# Patient Record
Sex: Female | Born: 1995 | Race: Black or African American | Hispanic: No | Marital: Single | Smoking: Never smoker
Health system: Southern US, Community
[De-identification: ages and names within clinical notes are randomized; demographics above are authoritative.]

---
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
## 2016-05-03 IMAGING — DX DG CHEST 2V
2 series · 2 of 2 positions shown · non-contrast
Comparison: None.

CLINICAL DATA: Upper chest pain, shortness of breath and productive
cough for the past 3 weeks.

EXAM:
CHEST  2 VIEW

[chest pa]
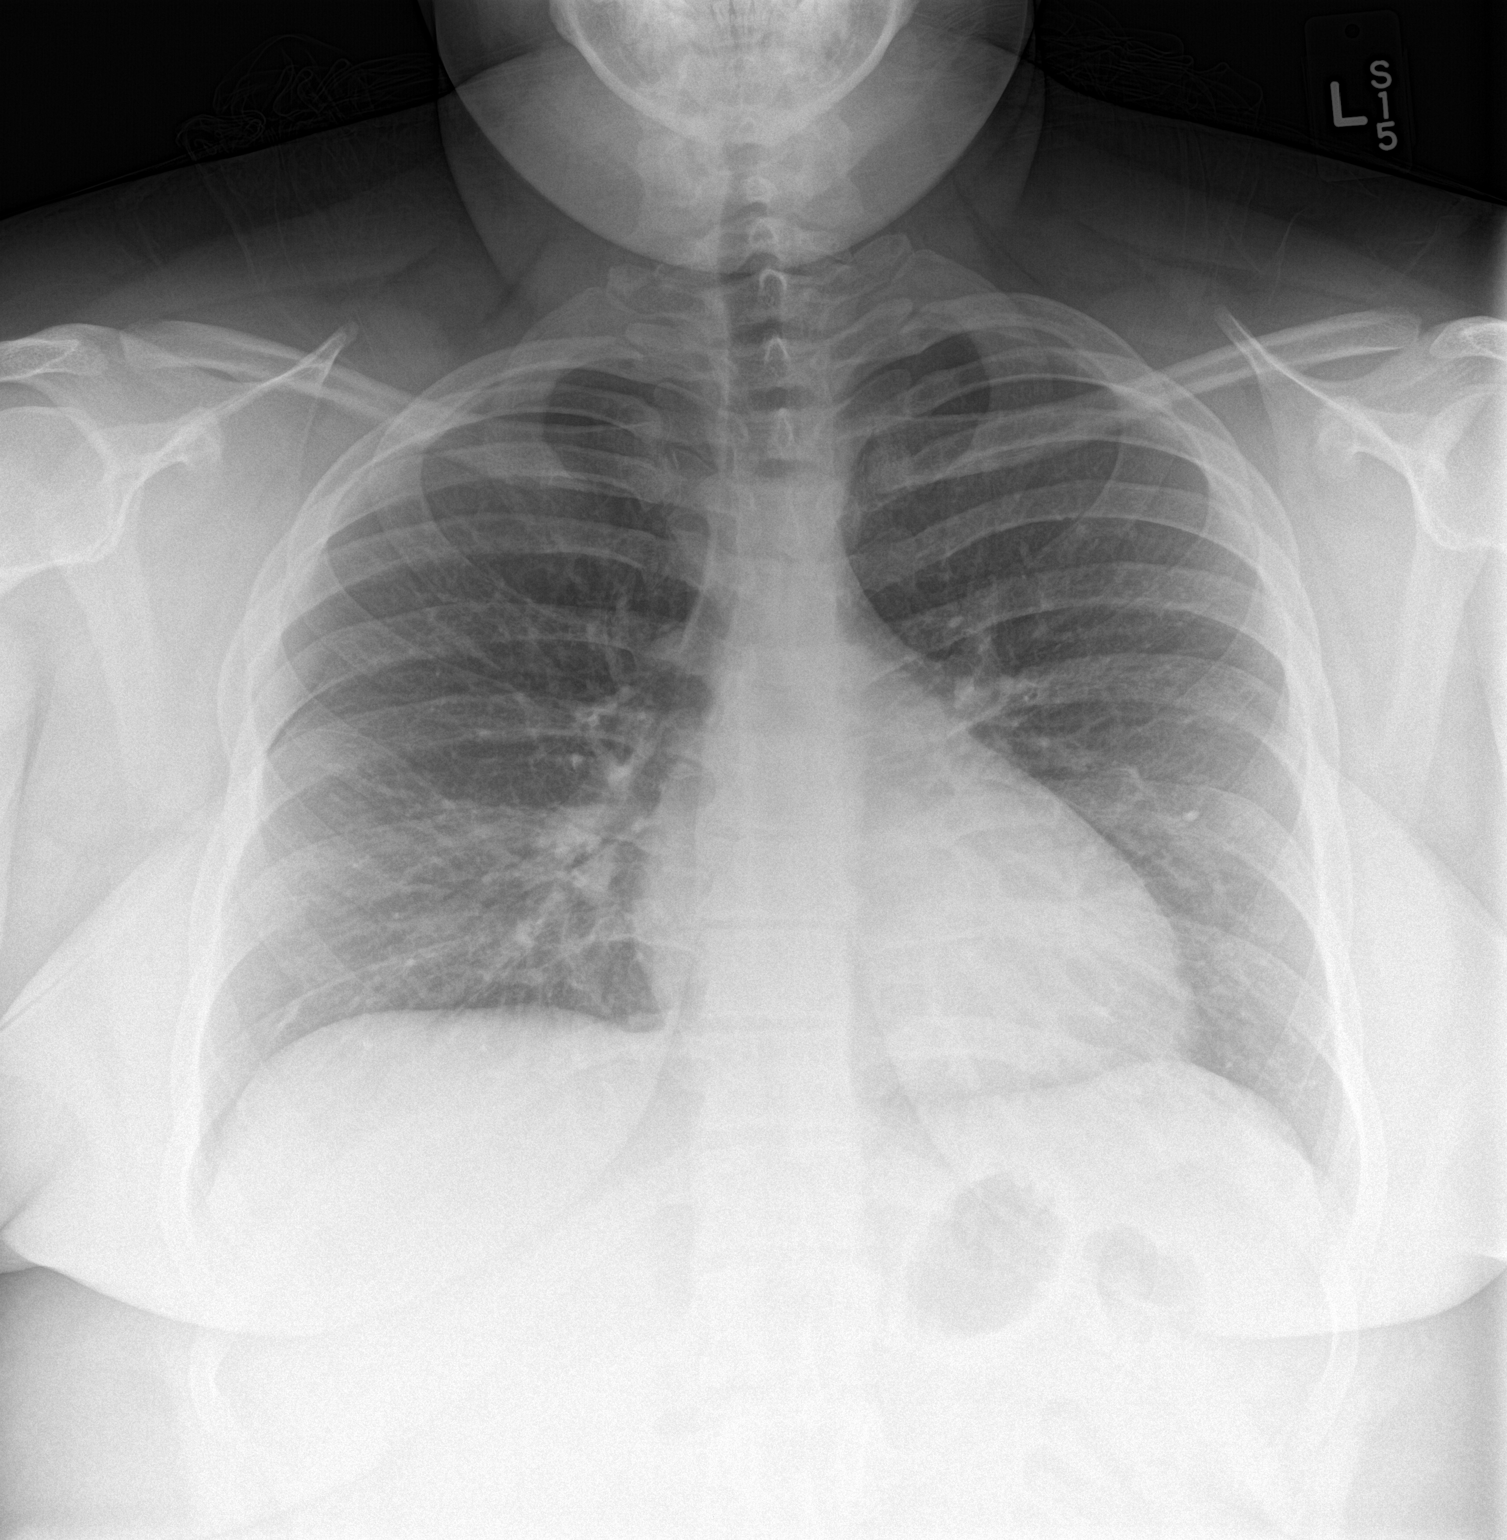

[chest lat]
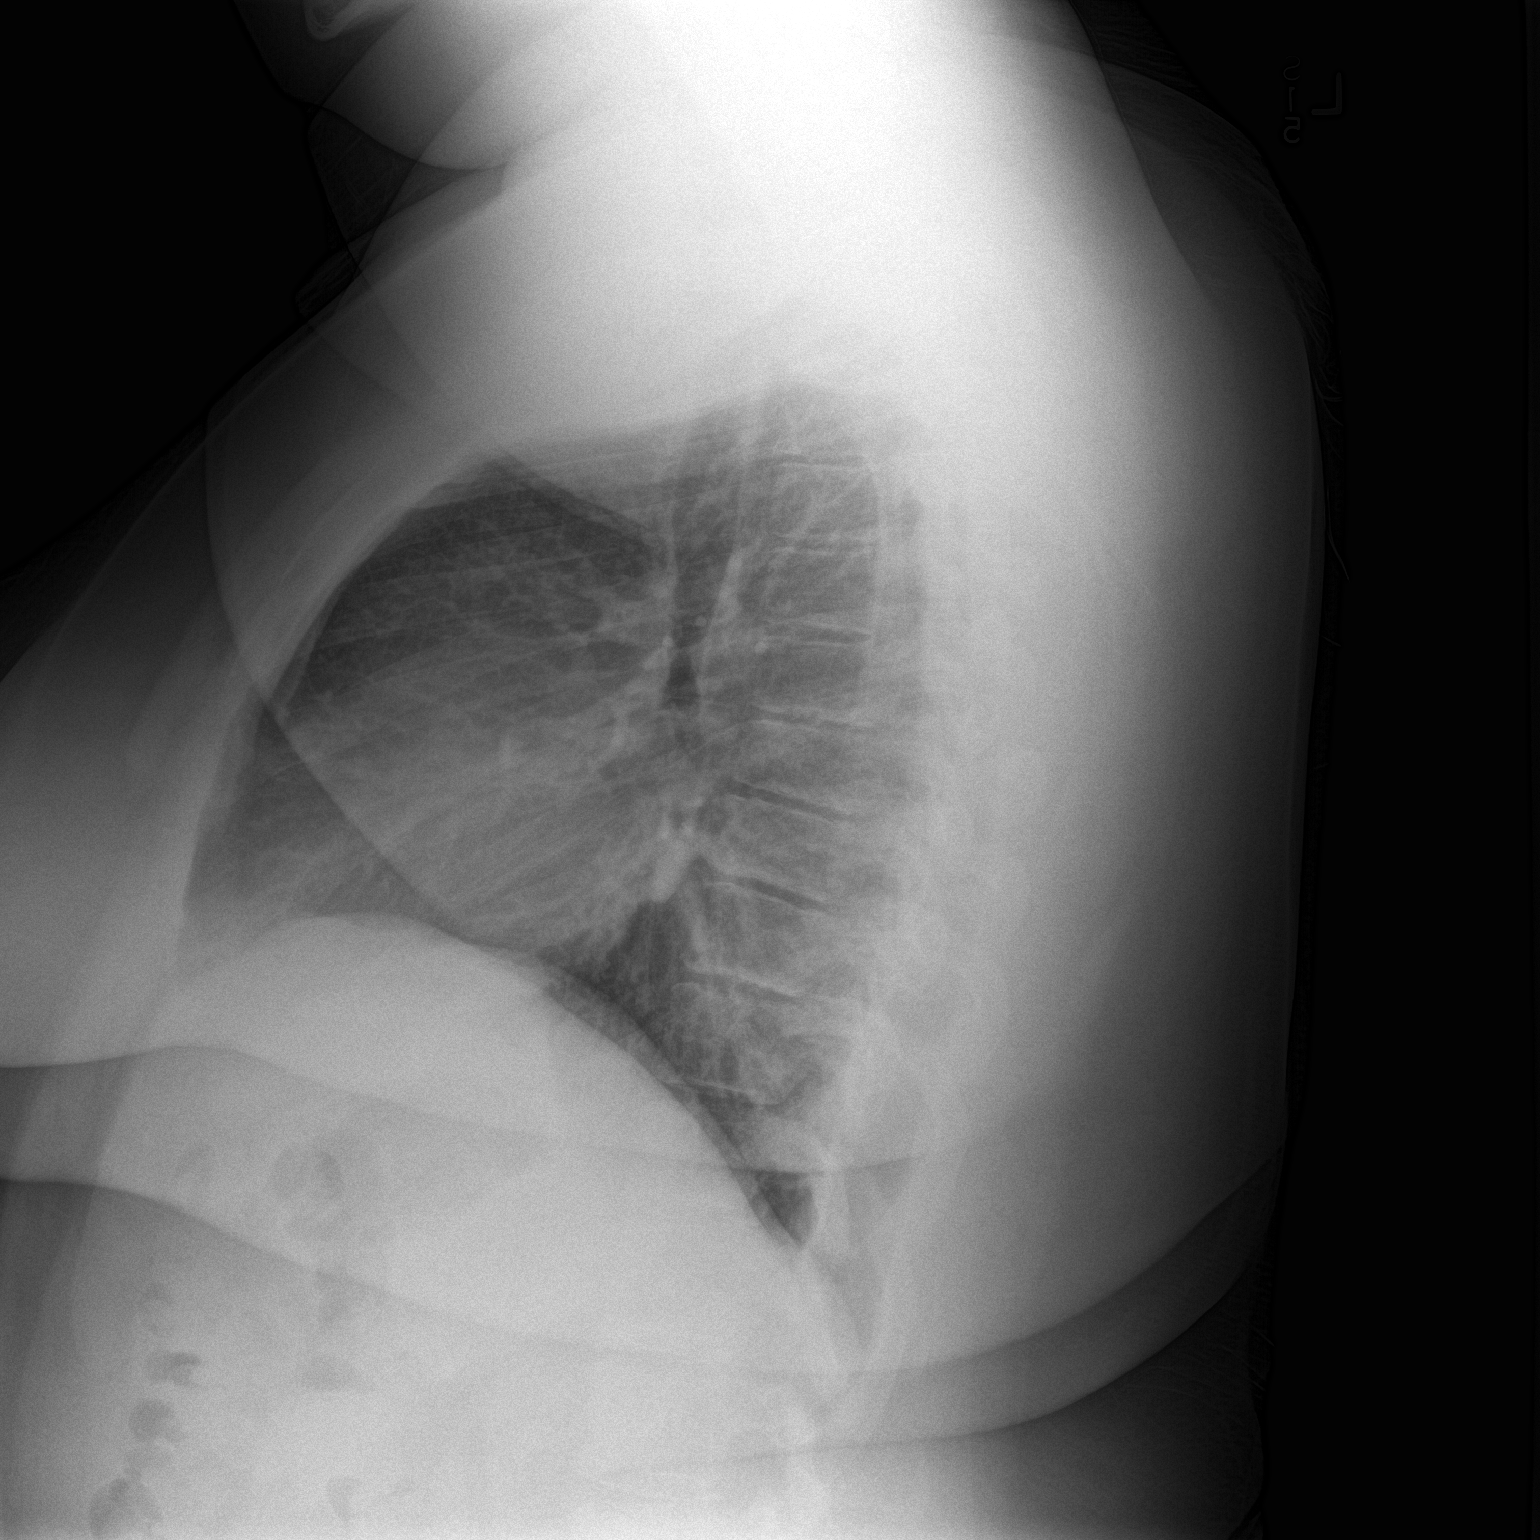

[2 of 2 positions shown; findings below may reference images not displayed]

FINDINGS: Normal sized heart. Clear lungs. Minimal diffuse peribronchial
thickening. Unremarkable bones.
IMPRESSION: Minimal bronchitic changes.
